# Patient Record
Sex: Male | Born: 1990 | Race: White | Hispanic: No | Marital: Single | Smoking: Current every day smoker
Health system: Southern US, Community
[De-identification: ages and names within clinical notes are randomized; demographics above are authoritative.]

## PROBLEM LIST (undated history)

## (undated) HISTORY — PX: APPENDECTOMY: SHX54

---
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
## 2014-07-07 ENCOUNTER — Encounter (HOSPITAL_COMMUNITY): Payer: Self-pay | Admitting: Emergency Medicine

## 2014-07-07 ENCOUNTER — Emergency Department (HOSPITAL_COMMUNITY)
Admission: EM | Admit: 2014-07-07 | Discharge: 2014-07-07 | Disposition: A | Discharge status: Home / Self Care | Payer: Self-pay | Attending: Emergency Medicine | Admitting: Emergency Medicine

## 2014-07-07 DIAGNOSIS — Z720 Tobacco use: Secondary | ICD-10-CM | POA: Insufficient documentation

## 2014-07-07 DIAGNOSIS — M533 Sacrococcygeal disorders, not elsewhere classified: Secondary | ICD-10-CM | POA: Insufficient documentation

## 2014-07-07 MED ORDER — HYDROCODONE-ACETAMINOPHEN 5-325 MG PO TABS: 2.0000 | ORAL_TABLET | ORAL | Status: AC | PRN

## 2014-07-07 MED ORDER — IBUPROFEN 800 MG PO TABS: 800.0000 mg | ORAL_TABLET | Freq: Three times a day (TID) | ORAL | Status: AC

## 2014-07-07 NOTE — ED Provider Notes (Signed)
CSN: [id number]     Arrival date & time 07/07/14  1151 History  This chart was scribed for non-physician practitioner, Langston MaskerKaren Sofia, PA-C, working with Toy CookeyMegan Docherty, MD by Charline BillsEssence Howell, ED Scribe. This patient was seen in room WTR6/WTR6 and the patient's care was started at 12:12 PM.   Chief Complaint  Patient presents with  . Back Pain   The history is provided by the patient. No language interpreter was used.   HPI Comments: Roger Alexander is a 23 y.o. male who presents to the Emergency Department complaining of constant lower back pain onset 2 days ago. Pain is exacerbated with sitting. 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 He denies falls or recent injury. Pt also denies swelling. He has tried Tylenol without relief. No known allergies. No PCP.  History reviewed. No pertinent past medical history. Past Surgical History  Procedure Laterality Date  . Appendectomy     History reviewed. No pertinent family history. History  Substance Use Topics  . Smoking status: Current Every Day Smoker  . Smokeless tobacco: Not on file  . Alcohol Use: No    Review of Systems  Musculoskeletal: Positive for back pain. Negative for joint swelling.  All other systems reviewed and are negative.  Allergies  Review of patient's allergies indicates no known allergies.  Home Medications   Prior to Admission medications   Not on File   Triage Vitals: BP 120/70 mmHg  Pulse 99  Temp(Src) 98 F (36.7 C)  Resp 16  SpO2 100% Physical Exam  Constitutional: He is oriented to person, place, and time. He appears well-developed and well-nourished. No distress.  HENT:  Head: Normocephalic and atraumatic.  Eyes: Conjunctivae and EOM are normal.  Neck: Neck supple. No tracheal deviation present.  Cardiovascular: Normal rate.   Pulmonary/Chest: Effort normal. No respiratory distress.  Musculoskeletal: Normal range of motion.  Tender to coccyx area. No redness  or swelling. No signs of pilonidal cyst.  Neurological: He is alert and oriented to person, place, and time.  Skin: Skin is warm and dry.  Psychiatric: He has a normal mood and affect. His behavior is normal.  Nursing note and vitals reviewed.  ED Course  Procedures (including critical care time) DIAGNOSTIC STUDIES: Oxygen Saturation is 100% on RA, normal by my interpretation.    COORDINATION OF CARE: 12:16 PM-Discussed treatment plan with pt at bedside and pt agreed to plan.   Labs Review Labs Reviewed - No data to display  Imaging Review No results found.   EKG Interpretation None      MDM   Final diagnoses:  Coccydynia    Pt counseled on possible early cyst.  Pt advised 2 day recheck Rx for hydrocodone Rx for ibuprofen  I personally performed the services described in this documentation, which was scribed in my presence. The recorded information has been reviewed and is accurate.    Lonia SkinnerLeslie K AnawaltSofia, PA-C 07/07/14 1224  Toy CookeyMegan Docherty, MD 07/07/14 [phone]

## 2014-07-07 NOTE — ED Notes (Signed)
Pt states that he began having low back pain 2 days ago. 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  Took 2g of tylenol this morning with some relief.

## 2014-07-07 NOTE — Discharge Instructions (Signed)
Tailbone Injury  The tailbone (coccyx) is the small bone at the lower end of the spine. A tailbone injury may involve stretched ligaments, bruising, or a broken bone (fracture). Women are more vulnerable to this injury due to having a wider pelvis.  CAUSES   This type of injury typically occurs from falling and landing on the tailbone. Repeated strain or friction from actions such as rowing and bicycling may also injure the area. The tailbone can be injured during childbirth. Infections or tumors may also press on the tailbone and cause pain. Sometimes, the cause of injury is unknown.  SYMPTOMS    Bruising.   Pain when sitting.   Painful bowel movements.   In women, pain during intercourse.  DIAGNOSIS   Your caregiver can diagnose a tailbone injury based on your symptoms and a physical exam. X-rays may be taken if a fracture is suspected. Your caregiver may also use an MRI scan imaging test to evaluate your symptoms.  TREATMENT   Your caregiver may prescribe medicines to help relieve your pain. Most tailbone injuries heal on their own in 4 to 6 weeks. However, if the injury is caused by an infection or tumor, the recovery period may vary.  PREVENTION   Wear appropriate padding and sports gear when bicycling and rowing. This can help prevent an injury from repeated strain or friction.  HOME CARE INSTRUCTIONS    Put ice on the injured area.   Put ice in a plastic bag.   Place a towel between your skin and the bag.   Leave the ice on for 15-20 minutes, every hour while awake for the first 1 to 2 days.   Sit on a large, rubber or inflated ring or cushion to ease your pain. Lean forward when sitting to help decrease discomfort.   Avoid sitting for long periods of time.   Increase your activity as the pain allows.   Only take over-the-counter or prescription medicines for pain, discomfort, or fever as directed by your caregiver.   You may use stool softeners if it is painful to have a bowel movement, or as  directed by your caregiver.   Eat a diet with plenty of fiber to help prevent constipation.   Keep all follow-up appointments as directed by your caregiver.  SEEK MEDICAL CARE IF:    Your pain becomes worse.   Your bowel movements cause a great deal of discomfort.   You are unable to have a bowel movement.   You have a fever.  MAKE SURE YOU:   Understand these instructions.   Will watch your condition.   Will get help right away if you are not doing well or get worse.  Document Released: 07/19/2000 Document Revised: 10/14/2011 Document Reviewed: 02/14/2011  Exit[employer] Patient Information 2015 [employer]. This information is not intended to replace advice given to you by your health care provider. Make sure you discuss any questions you have with your health care provider.

## 2016-07-21 DIAGNOSIS — Y92410 Unspecified street and highway as the place of occurrence of the external cause: Secondary | ICD-10-CM | POA: Insufficient documentation

## 2016-07-21 DIAGNOSIS — S86811A Strain of other muscle(s) and tendon(s) at lower leg level, right leg, initial encounter: Secondary | ICD-10-CM | POA: Insufficient documentation

## 2016-07-21 DIAGNOSIS — S5001XA Contusion of right elbow, initial encounter: Secondary | ICD-10-CM | POA: Insufficient documentation

## 2016-07-21 DIAGNOSIS — S8991XA Unspecified injury of right lower leg, initial encounter: Secondary | ICD-10-CM | POA: Diagnosis present

## 2016-07-21 DIAGNOSIS — Y939 Activity, unspecified: Secondary | ICD-10-CM | POA: Insufficient documentation

## 2016-07-21 DIAGNOSIS — Y999 Unspecified external cause status: Secondary | ICD-10-CM | POA: Diagnosis not present

## 2016-07-21 DIAGNOSIS — F17200 Nicotine dependence, unspecified, uncomplicated: Secondary | ICD-10-CM | POA: Diagnosis not present

## 2016-07-22 ENCOUNTER — Encounter (HOSPITAL_COMMUNITY): Payer: Self-pay | Admitting: *Deleted

## 2016-07-22 ENCOUNTER — Emergency Department (HOSPITAL_COMMUNITY)
Admission: EM | Admit: 2016-07-22 | Discharge: 2016-07-22 | Disposition: A | Discharge status: Home / Self Care | Payer: No Typology Code available for payment source | Attending: Emergency Medicine | Admitting: Emergency Medicine

## 2016-07-22 ENCOUNTER — Emergency Department (HOSPITAL_COMMUNITY): Payer: No Typology Code available for payment source

## 2016-07-22 DIAGNOSIS — S86811A Strain of other muscle(s) and tendon(s) at lower leg level, right leg, initial encounter: Secondary | ICD-10-CM

## 2016-07-22 DIAGNOSIS — S5001XA Contusion of right elbow, initial encounter: Secondary | ICD-10-CM

## 2016-07-22 MED ORDER — KETOROLAC TROMETHAMINE 60 MG/2ML IM SOLN
60.0000 mg | Freq: Once | INTRAMUSCULAR | Status: AC
  Administered 2016-07-22: 60 mg via INTRAMUSCULAR
  Filled 2016-07-22: qty 2

## 2016-07-22 MED ORDER — NAPROXEN 500 MG PO TABS: 500.0000 mg | ORAL_TABLET | Freq: Two times a day (BID) | ORAL | 0 refills | Status: AC

## 2016-07-22 NOTE — ED Notes (Signed)
Patient transported to X-ray 
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
## 2016-07-22 NOTE — ED Notes (Signed)
Pt understood dc material. NAD noted. Script given at dc  
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
## 2018-02-13 ENCOUNTER — Encounter (HOSPITAL_COMMUNITY): Payer: Self-pay | Admitting: Emergency Medicine

## 2018-02-13 ENCOUNTER — Other Ambulatory Visit: Payer: Self-pay

## 2018-02-13 ENCOUNTER — Emergency Department (HOSPITAL_COMMUNITY)
Admission: EM | Admit: 2018-02-13 | Discharge: 2018-02-13 | Disposition: A | Discharge status: Home / Self Care | Payer: Self-pay | Attending: Emergency Medicine | Admitting: Emergency Medicine

## 2018-02-13 DIAGNOSIS — Z79899 Other long term (current) drug therapy: Secondary | ICD-10-CM | POA: Insufficient documentation

## 2018-02-13 DIAGNOSIS — F17200 Nicotine dependence, unspecified, uncomplicated: Secondary | ICD-10-CM | POA: Insufficient documentation

## 2018-02-13 DIAGNOSIS — J020 Streptococcal pharyngitis: Secondary | ICD-10-CM | POA: Insufficient documentation

## 2018-02-13 DIAGNOSIS — Z209 Contact with and (suspected) exposure to unspecified communicable disease: Secondary | ICD-10-CM | POA: Insufficient documentation

## 2018-02-13 LAB — GROUP A STREP BY PCR: GROUP A STREP BY PCR: DETECTED — AB

## 2018-02-13 MED ORDER — AMOXICILLIN 500 MG PO CAPS: 500.0000 mg | ORAL_CAPSULE | Freq: Three times a day (TID) | ORAL | 0 refills | Status: AC

## 2018-02-13 NOTE — ED Triage Notes (Signed)
Pt arrives with c/o sore throat and fever x2 days. Reports irritation when swallowing, tylenol effective for pain.

## 2018-02-13 NOTE — ED Provider Notes (Signed)
[facility]   CSN: [id number] Arrival date & time: 02/13/18  2131     History   Chief Complaint Chief Complaint  Patient presents with  . Sore Throat    HPI Roger Alexander is a 27 y.o. male.  Patient presents to the emergency department with a chief complaint of sore throat.  He states that he has been sick for the past day or so.  Reports associated fevers and chills at home, but he has taken Tylenol and throat lozenges for this.  States that his fiance was recently diagnosed with strep throat as well.  He denies any other associated symptoms.  States his symptoms are worsened with swallowing.  The history is provided by the patient. No language interpreter was used.    History reviewed. No pertinent past medical history.  There are no active problems to display for this patient.   Past Surgical History:  Procedure Laterality Date  . APPENDECTOMY          Home Medications    Prior to Admission medications   Medication Sig Start Date End Date Taking? Authorizing Provider  amoxicillin (AMOXIL) 500 MG capsule Take 1 capsule (500 mg total) by mouth 3 (three) times daily. 02/13/18   Roxy HorsemanBrowning, Roger Mota, PA-C  HYDROcodone-acetaminophen (NORCO/VICODIN) 5-325 MG per tablet Take 2 tablets by mouth every 4 (four) hours as needed. 07/07/14   Elson AreasSofia, Roger K, PA-C  ibuprofen (ADVIL,MOTRIN) 800 MG tablet Take 1 tablet (800 mg total) by mouth 3 (three) times daily. 07/07/14   Elson AreasSofia, Roger K, PA-C  naproxen (NAPROSYN) 500 MG tablet Take 1 tablet (500 mg total) by mouth 2 (two) times daily. 07/22/16   Antony MaduraHumes, Kelly, PA-C    Family History History reviewed. No pertinent family history.  Social History Social History   Tobacco Use  . Smoking status: Current Every Day Smoker  . Smokeless tobacco: Never Used  Substance Use Topics  . Alcohol use: No  . Drug use: No     Allergies   Patient has no known allergies.   Review  of Systems Review of Systems  All other systems reviewed and are negative.    Physical Exam Updated Vital Signs BP 134/75 (BP Location: Right Arm)   Pulse (!) 113   Temp 98.4 F (36.9 C) (Oral)   Resp 18   SpO2 99%   Physical Exam  Constitutional: He is oriented to person, place, and time. He appears well-developed and well-nourished.  HENT:  Head: Normocephalic and atraumatic.  Oropharynx is erythematous with mild exudates, no abscess, uvula is midline, airway intact, no stridor  Eyes: Conjunctivae and EOM are normal.  Neck: Normal range of motion.  Cardiovascular: Normal rate.  Pulmonary/Chest: Effort normal.  Abdominal: He exhibits no distension.  Musculoskeletal: Normal range of motion.  Neurological: He is alert and oriented to person, place, and time.  Skin: Skin is dry.  Psychiatric: He has a normal mood and affect. His behavior is normal. Judgment and thought content normal.  Nursing note and vitals reviewed.    ED Treatments / Results  Labs (all labs ordered are listed, but only abnormal results are displayed) Labs Reviewed  GROUP A STREP BY PCR - Abnormal; Notable for the following components:      Result Value   Group A Strep by PCR DETECTED (*)    All other components within normal limits    EKG None  Radiology No results found.  Procedures Procedures (including critical  care time)  Medications Ordered in ED Medications - No data to display   Initial Impression / Assessment and Plan / ED Course  I have reviewed the triage vital signs and the nursing notes.  Pertinent labs & imaging results that were available during my care of the patient were reviewed by me and considered in my medical decision making (see chart for details).     Patient was sore throat x2 days.  Subjective fevers chills.  Strep test positive.  Roger Alexander was diagnosed with the same recently.  Will treat with amoxicillin.  Final Clinical Impressions(s) / ED Diagnoses   Final  diagnoses:  Strep pharyngitis    ED Discharge Orders        Ordered    amoxicillin (AMOXIL) 500 MG capsule  3 times daily     02/13/18 2330       Roxy Horseman, PA-C 02/13/18 2332    Azalia Bilis, MD 02/14/18 [phone]

## 2018-08-10 ENCOUNTER — Other Ambulatory Visit: Payer: Self-pay

## 2018-08-10 ENCOUNTER — Encounter (HOSPITAL_COMMUNITY): Payer: Self-pay | Admitting: *Deleted

## 2018-08-10 ENCOUNTER — Emergency Department (HOSPITAL_COMMUNITY)
Admission: EM | Admit: 2018-08-10 | Discharge: 2018-08-10 | Disposition: A | Discharge status: Home / Self Care | Payer: Self-pay | Attending: Emergency Medicine | Admitting: Emergency Medicine

## 2018-08-10 DIAGNOSIS — F17210 Nicotine dependence, cigarettes, uncomplicated: Secondary | ICD-10-CM | POA: Insufficient documentation

## 2018-08-10 DIAGNOSIS — Z79899 Other long term (current) drug therapy: Secondary | ICD-10-CM | POA: Insufficient documentation

## 2018-08-10 DIAGNOSIS — R21 Rash and other nonspecific skin eruption: Secondary | ICD-10-CM | POA: Insufficient documentation

## 2018-08-10 MED ORDER — TRIAMCINOLONE ACETONIDE 0.025 % EX OINT: 1.0000 "application " | TOPICAL_OINTMENT | Freq: Two times a day (BID) | CUTANEOUS | 1 refills | Status: AC

## 2018-08-10 NOTE — ED Provider Notes (Signed)
[facility]-EMERGENCY DEPT Provider Note   CSN: [id number] Arrival date & time: 08/10/18  0254     History   Chief Complaint Chief Complaint  Patient presents with  . Rash    HPI Roger Alexander is a 28 y.o. male.  Patient presents to the emergency department with a chief complaint of rash.  He states that he noticed a rash on his left foot today when he got home from work.  He states that it itches and burns.  Denies any fevers.  Denies any new contacts to irritating substances, soaps, or detergents.  Denies any treatments prior to arrival.  Aggravated by itching.  The history is provided by the patient. No language interpreter was used.    History reviewed. No pertinent past medical history.  There are no active problems to display for this patient.   Past Surgical History:  Procedure Laterality Date  . APPENDECTOMY          Home Medications    Prior to Admission medications   Medication Sig Start Date End Date Taking? Authorizing Provider  acetaminophen (TYLENOL) 500 MG tablet Take 1,000-2,000 mg by mouth every 6 (six) hours as needed for moderate pain.   Yes [provider]  amoxicillin (AMOXIL) 500 MG capsule Take 1 capsule (500 mg total) by mouth 3 (three) times daily. Patient not taking: Reported on 08/10/2018 02/13/18   Roxy Horseman, PA-C  HYDROcodone-acetaminophen (NORCO/VICODIN) 5-325 MG per tablet Take 2 tablets by mouth every 4 (four) hours as needed. Patient not taking: Reported on 08/10/2018 07/07/14   Elson Areas, PA-C  ibuprofen (ADVIL,MOTRIN) 800 MG tablet Take 1 tablet (800 mg total) by mouth 3 (three) times daily. Patient not taking: Reported on 08/10/2018 07/07/14   Elson Areas, PA-C  naproxen (NAPROSYN) 500 MG tablet Take 1 tablet (500 mg total) by mouth 2 (two) times daily. Patient not taking: Reported on 08/10/2018 07/22/16   Antony Madura, PA-C    Family History No family history on file.  Social  History Social History   Tobacco Use  . Smoking status: Current Every Day Smoker  . Smokeless tobacco: Never Used  Substance Use Topics  . Alcohol use: No  . Drug use: No     Allergies   Patient has no known allergies.   Review of Systems Review of Systems  All other systems reviewed and are negative.    Physical Exam Updated Vital Signs BP 131/86 (BP Location: Right Arm)   Pulse (!) 105   Temp 98.8 F (37.1 C) (Oral)   Resp 18   Ht 6' (1.829 m)   Wt 99.8 kg   SpO2 100%   BMI 29.84 kg/m   Physical Exam Vitals signs and nursing note reviewed.  Constitutional:      Appearance: He is well-developed.  HENT:     Head: Normocephalic and atraumatic.  Eyes:     Conjunctiva/sclera: Conjunctivae normal.  Neck:     Musculoskeletal: Normal range of motion.  Cardiovascular:     Rate and Rhythm: Normal rate.  Pulmonary:     Effort: Pulmonary effort is normal.  Abdominal:     General: There is no distension.  Musculoskeletal: Normal range of motion.  Skin:    General: Skin is dry.     Comments: As pictured  Neurological:     Mental Status: He is alert and oriented to person, place, and time.  Psychiatric:        Behavior: Behavior normal.  Thought Content: Thought content normal.        Judgment: Judgment normal.        ED Treatments / Results  Labs (all labs ordered are listed, but only abnormal results are displayed) Labs Reviewed - No data to display  EKG None  Radiology No results found.  Procedures Procedures (including critical care time)  Medications Ordered in ED Medications - No data to display   Initial Impression / Assessment and Plan / ED Course  I have reviewed the triage vital signs and the nursing notes.  Pertinent labs & imaging results that were available during my care of the patient were reviewed by me and considered in my medical decision making (see chart for details).      Patient with pruritic rash on feet of  unknown origin.  Spares soles and hands.  VSS.  Will treat with topical kenalog and oral benadryl.  Final Clinical Impressions(s) / ED Diagnoses   Final diagnoses:  Rash    ED Discharge Orders         Ordered    triamcinolone (KENALOG) 0.025 % ointment  2 times daily     08/10/18 0418           Roxy HorsemanBrowning, Jarom Govan, PA-C 08/10/18 0431    Devoria AlbeKnapp, Iva, MD 08/10/18 [phone]

## 2018-08-10 NOTE — ED Notes (Signed)
ED Provider at bedside. 
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
## 2019-10-19 ENCOUNTER — Ambulatory Visit: Payer: Self-pay | Admitting: Adult Health

## 2019-10-19 DIAGNOSIS — Z0289 Encounter for other administrative examinations: Secondary | ICD-10-CM

## 2021-08-02 ENCOUNTER — Telehealth: Payer: Self-pay | Admitting: Emergency Medicine

## 2021-08-02 NOTE — Telephone Encounter (Signed)
Patient's mother called to see if patient would be accepted on again by [facility] as a new patient.  Patient no showed his appointment in March 2021 and did not reschedule.   Please advise

## 2021-08-03 NOTE — Telephone Encounter (Signed)
Attempted to contact patient through phone and was given busy signal. No DPR so unable to contact mother to schedule, no mychart either. I will try again later

## 2021-08-03 NOTE — Telephone Encounter (Signed)
Noted  

## 2021-08-10 NOTE — Telephone Encounter (Signed)
Attempted to call again but still the busy signal, Will attempt one more time later

## 2021-08-21 NOTE — Telephone Encounter (Signed)
Noted  

## 2021-08-21 NOTE — Telephone Encounter (Signed)
Attempted to contact patient, number may be disconnected. No alternative number on file and no DPR to speak with mother as alternative. I have tried three different times, message will be closed
